# Patient Record
Sex: Female | Born: 1950 | Race: Black or African American | Hispanic: No | Marital: Single | Smoking: Never smoker
Health system: Southern US, Community
[De-identification: ages and names within clinical notes are randomized; demographics above are authoritative.]

## PROBLEM LIST (undated history)

## (undated) DIAGNOSIS — I10 Essential (primary) hypertension: Secondary | ICD-10-CM

## (undated) DIAGNOSIS — M549 Dorsalgia, unspecified: Secondary | ICD-10-CM

## (undated) DIAGNOSIS — E7800 Pure hypercholesterolemia, unspecified: Secondary | ICD-10-CM

## (undated) DIAGNOSIS — G8929 Other chronic pain: Secondary | ICD-10-CM

## (undated) HISTORY — PX: CHOLECYSTECTOMY: SHX55

## (undated) HISTORY — PX: ABDOMINAL HYSTERECTOMY: SHX81

---

## 2011-06-14 ENCOUNTER — Emergency Department (HOSPITAL_BASED_OUTPATIENT_CLINIC_OR_DEPARTMENT_OTHER)
Admission: EM | Admit: 2011-06-14 | Discharge: 2011-06-14 | Disposition: A | Discharge status: Home / Self Care | Payer: BC Managed Care – PPO | Attending: Emergency Medicine | Admitting: Emergency Medicine

## 2011-06-14 ENCOUNTER — Emergency Department (INDEPENDENT_AMBULATORY_CARE_PROVIDER_SITE_OTHER): Payer: BC Managed Care – PPO

## 2011-06-14 ENCOUNTER — Other Ambulatory Visit: Payer: Self-pay

## 2011-06-14 ENCOUNTER — Encounter (HOSPITAL_BASED_OUTPATIENT_CLINIC_OR_DEPARTMENT_OTHER): Payer: Self-pay | Admitting: Emergency Medicine

## 2011-06-14 DIAGNOSIS — M549 Dorsalgia, unspecified: Secondary | ICD-10-CM | POA: Insufficient documentation

## 2011-06-14 DIAGNOSIS — R51 Headache: Secondary | ICD-10-CM

## 2011-06-14 DIAGNOSIS — G8929 Other chronic pain: Secondary | ICD-10-CM | POA: Insufficient documentation

## 2011-06-14 DIAGNOSIS — I10 Essential (primary) hypertension: Secondary | ICD-10-CM | POA: Insufficient documentation

## 2011-06-14 DIAGNOSIS — R42 Dizziness and giddiness: Secondary | ICD-10-CM

## 2011-06-14 HISTORY — DX: Other chronic pain: G89.29

## 2011-06-14 HISTORY — DX: Essential (primary) hypertension: I10

## 2011-06-14 HISTORY — DX: Dorsalgia, unspecified: M54.9

## 2011-06-14 MED ORDER — MECLIZINE HCL 25 MG PO TABS: 25.0000 mg | ORAL_TABLET | Freq: Four times a day (QID) | ORAL | Status: AC

## 2011-06-14 NOTE — ED Notes (Signed)
Pt ambulated to ct and back without incident, normal gait

## 2011-06-14 NOTE — Discharge Instructions (Signed)
Benign Positional Vertigo Vertigo means you feel like you or your surroundings are moving when they are not. Benign positional vertigo is the most common form of vertigo. Benign means that the cause of your condition is not serious. Benign positional vertigo is more common in older adults. CAUSES  Benign positional vertigo is the result of an upset in the labyrinth system. This is an area in the middle ear that helps control your balance. This may be caused by a viral infection, head injury, or repetitive motion. However, often no specific cause is found. SYMPTOMS  Symptoms of benign positional vertigo occur when you move your head or eyes in different directions. Some of the symptoms may include:  Loss of balance and falls.   Vomiting.   Blurred vision.   Dizziness.   Nausea.   Involuntary eye movements (nystagmus).  DIAGNOSIS  Benign positional vertigo is usually diagnosed by physical exam. If the specific cause of your benign positional vertigo is unknown, your caregiver may perform imaging tests, such as magnetic resonance imaging (MRI) or computed tomography (CT). TREATMENT  Your caregiver may recommend movements or procedures to correct the benign positional vertigo. Medicines such as meclizine, benzodiazepines, and medicines for nausea may be used to treat your symptoms. In rare cases, if your symptoms are caused by certain conditions that affect the inner ear, you may need surgery. HOME CARE INSTRUCTIONS   Follow your caregiver's instructions.   Move slowly. Do not make sudden body or head movements.   Avoid driving.   Avoid operating heavy machinery.   Avoid performing any tasks that would be dangerous to you or others during a vertigo episode.   Drink enough fluids to keep your urine clear or pale yellow.  SEEK IMMEDIATE MEDICAL CARE IF:   You develop problems with walking, weakness, numbness, or using your arms, hands, or legs.   You have difficulty speaking.   You  develop severe headaches.   Your nausea or vomiting continues or gets worse.   You develop visual changes.   Your family or friends notice any behavioral changes.   Your condition gets worse.   You have a fever.   You develop a stiff neck or sensitivity to light.  MAKE SURE YOU:   Understand these instructions.   Will watch your condition.   Will get help right away if you are not doing well or get worse.  Document Released: 12/23/2005 Document Revised: 03/06/2011 Document Reviewed: 12/05/2010 [facility] Patient Information 2012 [place], [place].Benign Positional Vertigo Vertigo means you feel like you or your surroundings are moving when they are not. Benign positional vertigo is the most common form of vertigo. Benign means that the cause of your condition is not serious. Benign positional vertigo is more common in older adults. CAUSES  Benign positional vertigo is the result of an upset in the labyrinth system. This is an area in the middle ear that helps control your balance. This may be caused by a viral infection, head injury, or repetitive motion. However, often no specific cause is found. SYMPTOMS  Symptoms of benign positional vertigo occur when you move your head or eyes in different directions. Some of the symptoms may include:  Loss of balance and falls.   Vomiting.   Blurred vision.   Dizziness.   Nausea.   Involuntary eye movements (nystagmus).  DIAGNOSIS  Benign positional vertigo is usually diagnosed by physical exam. If the specific cause of your benign positional vertigo is unknown, your caregiver may perform imaging  tests, such as magnetic resonance imaging (MRI) or computed tomography (CT). TREATMENT  Your caregiver may recommend movements or procedures to correct the benign positional vertigo. Medicines such as meclizine, benzodiazepines, and medicines for nausea may be used to treat your symptoms. In rare cases, if your symptoms are caused by certain  conditions that affect the inner ear, you may need surgery. HOME CARE INSTRUCTIONS   Follow your caregiver's instructions.   Move slowly. Do not make sudden body or head movements.   Avoid driving.   Avoid operating heavy machinery.   Avoid performing any tasks that would be dangerous to you or others during a vertigo episode.   Drink enough fluids to keep your urine clear or pale yellow.  SEEK IMMEDIATE MEDICAL CARE IF:   You develop problems with walking, weakness, numbness, or using your arms, hands, or legs.   You have difficulty speaking.   You develop severe headaches.   Your nausea or vomiting continues or gets worse.   You develop visual changes.   Your family or friends notice any behavioral changes.   Your condition gets worse.   You have a fever.   You develop a stiff neck or sensitivity to light.  MAKE SURE YOU:   Understand these instructions.   Will watch your condition.   Will get help right away if you are not doing well or get worse.  Document Released: 12/23/2005 Document Revised: 03/06/2011 Document Reviewed: 12/05/2010 [facility] Patient Information 2012 [place], [place].

## 2011-06-14 NOTE — ED Provider Notes (Signed)
History     CSN: [id number]  Arrival date & time 06/14/11  1200   None     Chief Complaint  Patient presents with  . Dizziness  . Back Pain    (Consider location/radiation/quality/duration/timing/severity/associated sxs/prior treatment) Patient is a 61 y.o. female presenting with general illness. The history is provided by the patient. No language interpreter was used.  Illness  Episode onset: a month ago. The onset was gradual. The problem occurs continuously. The problem has been gradually worsening. The problem is moderate. The symptoms are relieved by nothing. The symptoms are aggravated by nothing. Associated symptoms include nausea and wheezing. Pertinent negatives include no decreased vision, no vomiting, no cough and no URI. There were no sick contacts. She has received no recent medical care.   Pt complains of having been dizzy for a month.  Pt reports she saw her medical doctor and had labs.  Pt reports her doctor told her she needed to have a scan of her head if dizziness continued.   Pt reports not on amy medications Past Medical History  Diagnosis Date  . Chronic back pain   . Hypertension     History reviewed. No pertinent past surgical history.  History reviewed. No pertinent family history.  History  Substance Use Topics  . Smoking status: Never Smoker   . Smokeless tobacco: Not on file  . Alcohol Use: No    OB History    Grav Para Term Preterm Abortions TAB SAB Ect Mult Living                  Review of Systems  Respiratory: Positive for wheezing. Negative for cough.   Gastrointestinal: Positive for nausea. Negative for vomiting.  All other systems reviewed and are negative.    Allergies  Review of patient's allergies indicates no known allergies.  Home Medications   Current Outpatient Rx  Name Route Sig Dispense Refill  . OMEGA-3 FATTY ACIDS 1000 MG PO CAPS Oral Take 1 g by mouth daily.    Marland Kitchen LOSARTAN POTASSIUM-HCTZ 100-12.5 MG PO TABS Oral  Take 1 tablet by mouth daily.    . MULTIVITAMINS PO CAPS Oral Take 1 capsule by mouth daily.    Marland Kitchen SIMVASTATIN 40 MG PO TABS Oral Take 40 mg by mouth every evening.      BP 153/72  Pulse 52  Temp(Src) 98.3 F (36.8 C) (Oral)  Resp 16  SpO2 100%  Physical Exam  Nursing note and vitals reviewed. Constitutional: She is oriented to person, place, and time. She appears well-developed and well-nourished.  HENT:  Head: Normocephalic.  Eyes: Conjunctivae and EOM are normal. Pupils are equal, round, and reactive to light.  Neck: Normal range of motion. Neck supple.  Cardiovascular: Normal rate.   Pulmonary/Chest: Effort normal.  Abdominal: Soft.  Musculoskeletal: Normal range of motion.  Neurological: She is alert and oriented to person, place, and time. She has normal reflexes.  Skin: Skin is warm.  Psychiatric: She has a normal mood and affect.    ED Course  Procedures (including critical care time)  Labs Reviewed - No data to display No results found.   No diagnosis found.    MDM   Date: 06/14/2011  Rate: 51  Rhythm: sinus bradycardia  QRS Axis: normal  Intervals: normal  ST/T Wave abnormalities: nonspecific T wave changes  Conduction Disutrbances:none  Narrative Interpretation:   Old EKG Reviewed: none available   No results found for this or any previous visit. Ct Head  Wo Contrast  06/14/2011  *RADIOLOGY REPORT*  Clinical Data: Dizziness, headache  CT HEAD WITHOUT CONTRAST  Technique:  Contiguous axial images were obtained from the base of the skull through the vertex without contrast.  Comparison: None.  Findings: No skull fracture is noted.  Paranasal sinuses and mastoid air cells are unremarkable.  No intracranial hemorrhage, mass effect or midline shift.  The gray and white matter differentiation is preserved.  No acute infarction.  No mass lesion is noted on this unenhanced scan.  No intra or extra-axial fluid collection.  IMPRESSION: No acute intracranial  abnormality.  Original Report Authenticated By: Natasha Mead, M.D.      Pt advised follow up with her MD for recheck this week.  I will treat with antivert.   Lonia Skinner [place], [place] 06/14/11 1618  Lonia Skinner [place], [place] 06/14/11 1620

## 2011-06-14 NOTE — ED Notes (Signed)
Pt having intermittent dizziness for two months.  Saw MD one month ago for same but continues to have the dizziness.  No weakness, LOC or blurry vision.  Lower back pain chronic but worse lately.

## 2011-06-15 NOTE — ED Provider Notes (Signed)
Medical screening examination/treatment/procedure(s) were performed by non-physician practitioner and as supervising physician I was immediately available for consultation/collaboration.   Jackie West. [name], MD 06/15/11 [phone]

## 2017-06-27 ENCOUNTER — Emergency Department (HOSPITAL_BASED_OUTPATIENT_CLINIC_OR_DEPARTMENT_OTHER)
Admission: EM | Admit: 2017-06-27 | Discharge: 2017-06-27 | Disposition: A | Discharge status: Home / Self Care | Payer: Commercial Managed Care - HMO | Attending: Emergency Medicine | Admitting: Emergency Medicine

## 2017-06-27 ENCOUNTER — Other Ambulatory Visit: Payer: Self-pay

## 2017-06-27 ENCOUNTER — Encounter (HOSPITAL_BASED_OUTPATIENT_CLINIC_OR_DEPARTMENT_OTHER): Payer: Self-pay | Admitting: Emergency Medicine

## 2017-06-27 DIAGNOSIS — I10 Essential (primary) hypertension: Secondary | ICD-10-CM | POA: Diagnosis not present

## 2017-06-27 DIAGNOSIS — Z79899 Other long term (current) drug therapy: Secondary | ICD-10-CM | POA: Insufficient documentation

## 2017-06-27 HISTORY — DX: Pure hypercholesterolemia, unspecified: E78.00

## 2017-06-27 MED ORDER — HYDROCHLOROTHIAZIDE 25 MG PO TABS: 12.5000 mg | ORAL_TABLET | Freq: Every day | ORAL | 0 refills | Status: AC

## 2017-06-27 MED ORDER — VALSARTAN 160 MG PO TABS: 160.0000 mg | ORAL_TABLET | Freq: Every day | ORAL | 0 refills | Status: AC

## 2017-06-27 NOTE — ED Triage Notes (Signed)
Pt states her BP medicine has been recalled so when she went to the pharmacy yesterday they would not refill it. Pt c/o headache.

## 2017-06-27 NOTE — ED Provider Notes (Signed)
[place] EMERGENCY DEPARTMENT Provider Note   CSN: [id number] Arrival date & time: 06/27/17  1455     History   Chief Complaint Chief Complaint  Patient presents with  . Hypertension    HPI Jackie West is a 67 y.o. female presenting for evaluation of high blood pressure and medication refill.  She states she has been on the same blood pressure medicine, a combo of losartan and hydrochlorothiazide for the past 5 years.  She has been on this for the same dose.  She states she tried to refill her blood pressure medication, but was told it was recalled and was unable to do so.  Last dose was last night.  She does not currently have any more pills.  Her PCP is Dr. Edison Pace.  She denies chest pain, shortness breath, vision changes, slurred speech, weakness, numbness.  She denies recent fevers, chills, nausea, vomiting, abdominal pain, urinary symptoms, abnormal bowel movements.  She reports a mild generalized headache.   HPI  Past Medical History:  Diagnosis Date  . Chronic back pain   . High cholesterol   . Hypertension     There are no active problems to display for this patient.   History reviewed. No pertinent surgical history.   OB History   None      Home Medications    Prior to Admission medications   Medication Sig Start Date End Date Taking? Authorizing Provider  losartan-hydrochlorothiazide (HYZAAR) 100-12.5 MG per tablet Take 1 tablet by mouth daily.   Yes [provider]  OVER THE COUNTER MEDICATION    Yes [provider]  fish oil-omega-3 fatty acids 1000 MG capsule Take 1 g by mouth daily.    [provider]  hydrochlorothiazide (HYDRODIURIL) 25 MG tablet Take 0.5 tablets (12.5 mg total) by mouth daily. 06/27/17   Delorese Sellin, PA-C  Multiple Vitamin (MULTIVITAMIN) capsule Take 1 capsule by mouth daily.    [provider]  simvastatin (ZOCOR) 40 MG tablet Take 40 mg by mouth every evening.    [provider]  valsartan (DIOVAN) 160 MG tablet Take 1 tablet (160 mg total) by mouth daily. 06/27/17   Imani Sherrin, PA-C    Family History No family history on file.  Social History Social History   Tobacco Use  . Smoking status: Never Smoker  . Smokeless tobacco: Never Used  Substance Use Topics  . Alcohol use: No  . Drug use: No     Allergies   Patient has no known allergies.   Review of Systems Review of Systems  Constitutional: Negative for chills and fever.  HENT: Negative for congestion.   Eyes: Negative for visual disturbance.  Respiratory: Negative for cough, chest tightness and shortness of breath.   Cardiovascular: Negative for chest pain and leg swelling.  Gastrointestinal: Negative for abdominal pain, nausea and vomiting.  Genitourinary: Negative for dysuria, hematuria and urgency.  Musculoskeletal: Negative for back pain and neck pain.  Neurological: Positive for headaches. Negative for dizziness, weakness, light-headedness and numbness.  Hematological: Does not bruise/bleed easily.  Psychiatric/Behavioral: Negative for confusion.     Physical Exam Updated Vital Signs BP 118/64 (BP Location: Left Arm)   Pulse 63   Temp 99.4 F (37.4 C) (Oral)   Resp 18   Ht 5\' 5"  (1.651 m)   Wt 82.1 kg (181 lb)   SpO2 98%   BMI 30.12 kg/m   Physical Exam  Constitutional: She is oriented to person, place, and time. She  appears well-developed and well-nourished. No distress.  HENT:  Head: Normocephalic and atraumatic.  Right Ear: Tympanic membrane, external ear and ear canal normal.  Left Ear: Tympanic membrane, external ear and ear canal normal.  Nose: Nose normal.  Mouth/Throat: Uvula is midline, oropharynx is clear and moist and mucous membranes are normal.  Eyes: Pupils are equal, round, and reactive to light. Conjunctivae and EOM are normal.  Neck: Normal range of motion. Neck supple.  Cardiovascular: Normal rate, regular rhythm and intact distal  pulses.  Pulmonary/Chest: Effort normal and breath sounds normal. No respiratory distress. She has no wheezes.  Abdominal: Soft. Bowel sounds are normal. She exhibits no distension. There is no tenderness.  Musculoskeletal: Normal range of motion.  Strength intact x4.  Sensation intact x4.  Radial and pedal pulses equal bilaterally.  Ambulatory without difficulty.  Neurological: She is alert and oriented to person, place, and time. She has normal strength. No cranial nerve deficit or sensory deficit. Coordination normal. GCS eye subscore is 4. GCS verbal subscore is 5. GCS motor subscore is 6.  No obvious neurologic deficits.  Nose to finger intact.  Skin: Skin is warm and dry.  Psychiatric: She has a normal mood and affect.  Nursing note and vitals reviewed.    ED Treatments / Results  Labs (all labs ordered are listed, but only abnormal results are displayed) Labs Reviewed - No data to display  EKG None  Radiology No results found.  Procedures Procedures (including critical care time)  Medications Ordered in ED Medications - No data to display   Initial Impression / Assessment and Plan / ED Course  I have reviewed the triage vital signs and the nursing notes.  Pertinent labs & imaging results that were available during my care of the patient were reviewed by me and considered in my medical decision making (see chart for details).     Patient presenting for evaluation of blood pressure and blood pressure medication refill due to recall.  Physical exam shows no neurologic deficits.  Blood pressure initially elevated, but resolved without intervention.  Doubt endorgan damage, no signs of hypertensive urgency.  Will refill blood pressure medications, and instructed to follow-up with primary care for further management.  At this time, patient appears safe for discharge.  Return precautions given.  Patient states she understands and agrees to plan.   Final Clinical Impressions(s)  / ED Diagnoses   Final diagnoses:  Hypertension, unspecified type    ED Discharge Orders        Ordered    hydrochlorothiazide (HYDRODIURIL) 25 MG tablet  Daily     06/27/17 1646    valsartan (DIOVAN) 160 MG tablet  Daily     06/27/17 1646       Layci Stenglein, PA-C 06/27/17 1709    Tanna Furry, MD 07/05/17 2340

## 2017-06-27 NOTE — Discharge Instructions (Signed)
Take both blood pressure medications daily.  Call your doctor on monday for further management of your medications. Return to the ER if you develop chest pain, difficulty breathing, weakness, numbness, or any new or concerning symptoms.

## 2017-08-06 ENCOUNTER — Other Ambulatory Visit: Payer: Self-pay

## 2017-08-06 ENCOUNTER — Emergency Department (HOSPITAL_BASED_OUTPATIENT_CLINIC_OR_DEPARTMENT_OTHER)
Admission: EM | Admit: 2017-08-06 | Discharge: 2017-08-07 | Disposition: A | Discharge status: Home / Self Care | Payer: Medicare HMO | Attending: Emergency Medicine | Admitting: Emergency Medicine

## 2017-08-06 ENCOUNTER — Emergency Department (HOSPITAL_BASED_OUTPATIENT_CLINIC_OR_DEPARTMENT_OTHER): Payer: Medicare HMO

## 2017-08-06 ENCOUNTER — Encounter (HOSPITAL_BASED_OUTPATIENT_CLINIC_OR_DEPARTMENT_OTHER): Payer: Self-pay | Admitting: Adult Health

## 2017-08-06 DIAGNOSIS — R1032 Left lower quadrant pain: Secondary | ICD-10-CM | POA: Diagnosis present

## 2017-08-06 DIAGNOSIS — I10 Essential (primary) hypertension: Secondary | ICD-10-CM | POA: Insufficient documentation

## 2017-08-06 DIAGNOSIS — Z79899 Other long term (current) drug therapy: Secondary | ICD-10-CM | POA: Insufficient documentation

## 2017-08-06 DIAGNOSIS — N838 Other noninflammatory disorders of ovary, fallopian tube and broad ligament: Secondary | ICD-10-CM | POA: Diagnosis not present

## 2017-08-06 LAB — CBC WITH DIFFERENTIAL/PLATELET
Basophils Absolute: 0 10*3/uL (ref 0.0–0.1)
Basophils Relative: 1 %
Eosinophils Absolute: 0.2 10*3/uL (ref 0.0–0.7)
Eosinophils Relative: 3 %
HCT: 39.7 % (ref 36.0–46.0)
Hemoglobin: 13.1 g/dL (ref 12.0–15.0)
Lymphocytes Relative: 39 %
Lymphs Abs: 2.2 10*3/uL (ref 0.7–4.0)
MCH: 28.3 pg (ref 26.0–34.0)
MCHC: 33 g/dL (ref 30.0–36.0)
MCV: 85.7 fL (ref 78.0–100.0)
Monocytes Absolute: 0.4 10*3/uL (ref 0.1–1.0)
Monocytes Relative: 8 %
Neutro Abs: 2.7 10*3/uL (ref 1.7–7.7)
Neutrophils Relative %: 49 %
Platelets: 277 10*3/uL (ref 150–400)
RBC: 4.63 MIL/uL (ref 3.87–5.11)
RDW: 13.6 % (ref 11.5–15.5)
WBC: 5.5 10*3/uL (ref 4.0–10.5)

## 2017-08-06 LAB — COMPREHENSIVE METABOLIC PANEL
ALT: 41 U/L (ref 14–54)
AST: 34 U/L (ref 15–41)
Albumin: 4 g/dL (ref 3.5–5.0)
Alkaline Phosphatase: 104 U/L (ref 38–126)
Anion gap: 9 (ref 5–15)
BUN: 13 mg/dL (ref 6–20)
CO2: 27 mmol/L (ref 22–32)
Calcium: 9.3 mg/dL (ref 8.9–10.3)
Chloride: 105 mmol/L (ref 101–111)
Creatinine, Ser: 0.7 mg/dL (ref 0.44–1.00)
GFR calc Af Amer: >60 mL/min (ref >60)
GFR calc non Af Amer: >60 mL/min (ref >60)
Glucose, Bld: 91 mg/dL (ref 65–99)
Potassium: 3.3 mmol/L — ABNORMAL LOW (ref 3.5–5.1)
Sodium: 141 mmol/L (ref 135–145)
Total Bilirubin: 0.3 mg/dL (ref 0.3–1.2)
Total Protein: 7.5 g/dL (ref 6.5–8.1)

## 2017-08-06 LAB — URINALYSIS, ROUTINE W REFLEX MICROSCOPIC
Bilirubin Urine: NEGATIVE
Glucose, UA: NEGATIVE mg/dL
Hgb urine dipstick: NEGATIVE
Ketones, ur: NEGATIVE mg/dL
Leukocytes, UA: NEGATIVE
Nitrite: NEGATIVE
Protein, ur: NEGATIVE mg/dL
Specific Gravity, Urine: <1.005 — ABNORMAL LOW (ref 1.005–1.030)
pH: 6.5 (ref 5.0–8.0)

## 2017-08-06 MED ORDER — IOPAMIDOL (ISOVUE-300) INJECTION 61%
100.0000 mL | Freq: Once | INTRAVENOUS | Status: AC | PRN
  Administered 2017-08-06: 100 mL via INTRAVENOUS

## 2017-08-06 MED ORDER — IBUPROFEN 800 MG PO TABS: 800.0000 mg | ORAL_TABLET | Freq: Three times a day (TID) | ORAL | 0 refills | Status: AC | PRN

## 2017-08-06 NOTE — ED Triage Notes (Signed)
Presents with a week and half of left sided leg pain that goes from groin, hip and down the front of her leg.

## 2017-08-06 NOTE — ED Provider Notes (Signed)
[facility] DEPART[facility] Provider Note   CSN: [id number] Arrival date & time: 08/06/17  1837     History   Chief Complaint Chief Complaint  Patient presents with  . Leg Pain  . Abdominal Pain    HPI Jackie West is a 67 y.o. female.  HPI Patient presents to the emergency department with left lower abdominal discomfort that started 1 week ago.  The patient states that she attempted to see her physician but was unable to get an appointment.  She states that the pain seemed to increase over that timeframe.  She states that she did not take any medications other than her arthritis medicines prior to arrival.  States that they did not seem to significantly decrease the pain.  Patient states that certain movements and palpation made the pain worse the patient denies chest pain, shortness of breath, headache,blurred vision, neck pain, fever, cough, weakness, numbness, dizziness, anorexia, edema, , nausea, vomiting, diarrhea, rash, back pain, dysuria, hematemesis, bloody stool, near syncope, or syncope. Past Medical History:  Diagnosis Date  . Chronic back pain   . High cholesterol   . Hypertension     There are no active problems to display for this patient.   History reviewed. No pertinent surgical history.   OB History   None      Home Medications    Prior to Admission medications   Medication Sig Start Date End Date Taking? Authorizing Provider  calcium-vitamin D (SM CALCIUM 500/VITAMIN D3) 500-400 MG-UNIT tablet Frequency:   Dosage:0   MG-UNIT  Instructions:  Note:1 tab daily 05/29/08  Yes [provider]  celecoxib (CELEBREX) 100 MG capsule TAKE 1 CAPSULE(100 MG) BY MOUTH TWICE DAILY 07/27/17  Yes [provider]  fish oil-omega-3 fatty acids 1000 MG capsule Take 1 g by mouth daily.    [provider]  hydrochlorothiazide (HYDRODIURIL) 25 MG tablet Take 0.5 tablets (12.5 mg total) by mouth daily. 06/27/17   Caccavale, Sophia,  PA-C  losartan-hydrochlorothiazide (HYZAAR) 100-12.5 MG per tablet Take 1 tablet by mouth daily.    [provider]  Multiple Vitamin (MULTIVITAMIN) capsule Take 1 capsule by mouth daily.    [provider]  OVER THE COUNTER MEDICATION     [provider]  simvastatin (ZOCOR) 40 MG tablet Take 40 mg by mouth every evening.    [provider]  valsartan (DIOVAN) 160 MG tablet Take 1 tablet (160 mg total) by mouth daily. 06/27/17   Caccavale, Sophia, PA-C    Family History History reviewed. No pertinent family history.  Social History Social History   Tobacco Use  . Smoking status: Never Smoker  . Smokeless tobacco: Never Used  Substance Use Topics  . Alcohol use: No  . Drug use: No     Allergies   Patient has no known allergies.   Review of Systems Review of Systems All other systems negative except as documented in the HPI. All pertinent positives and negatives as reviewed in the HPI.  Physical Exam Updated Vital Signs BP 139/72 (BP Location: Left Arm)   Pulse (!) 51   Temp 98.5 F (36.9 C) (Oral)   Resp 16   Ht 5\' 5"  (1.651 m)   Wt 78.5 kg (173 lb)   SpO2 98%   BMI 28.79 kg/m   Physical Exam  Constitutional: She is oriented to person, place, and time. She appears well-developed and well-nourished. No distress.  HENT:  Head: Normocephalic and atraumatic.  Mouth/Throat: Oropharynx is clear  and moist.  Eyes: Pupils are equal, round, and reactive to light.  Neck: Normal range of motion. Neck supple.  Cardiovascular: Normal rate, regular rhythm and normal heart sounds. Exam reveals no gallop and no friction rub.  No murmur heard. Pulmonary/Chest: Effort normal and breath sounds normal. No respiratory distress. She has no wheezes.  Abdominal: Soft. Bowel sounds are normal. She exhibits no distension. There is no hepatosplenomegaly or splenomegaly. There is tenderness in the left lower quadrant. There is no rigidity and no guarding.    Neurological: She is alert and oriented to person, place, and time. She exhibits normal muscle tone. Coordination normal.  Skin: Skin is warm and dry. Capillary refill takes less than 2 seconds. No rash noted. No erythema.  Psychiatric: She has a normal mood and affect. Her behavior is normal.  Nursing note and vitals reviewed.    ED Treatments / Results  Labs (all labs ordered are listed, but only abnormal results are displayed) Labs Reviewed  COMPREHENSIVE METABOLIC PANEL - Abnormal; Notable for the following components:      Result Value   Potassium 3.3 (*)    All other components within normal limits  URINALYSIS, ROUTINE W REFLEX MICROSCOPIC - Abnormal; Notable for the following components:   Specific Gravity, Urine <1.005 (*)    All other components within normal limits  CBC WITH DIFFERENTIAL/PLATELET    EKG None  Radiology Ct Abdomen Pelvis W Contrast  Result Date: 08/06/2017 CLINICAL DATA:  67 year old female with abdominal pain. Left hip pain. EXAM: CT ABDOMEN AND PELVIS WITH CONTRAST TECHNIQUE: Multidetector CT imaging of the abdomen and pelvis was performed using the standard protocol following bolus administration of intravenous contrast. CONTRAST:  125mL ISOVUE-300 IOPAMIDOL (ISOVUE-300) INJECTION 61% COMPARISON:  None. FINDINGS: Lower chest: Minimal bibasilar linear atelectasis/scarring. The visualized lung bases are otherwise clear. No intra-abdominal free air or free fluid. Hepatobiliary: The liver is unremarkable. No intrahepatic biliary ductal dilatation. Small stones noted within the gallbladder. No pericholecystic fluid or evidence of acute cholecystitis by CT. Pancreas: Unremarkable. No pancreatic ductal dilatation or surrounding inflammatory changes. Spleen: Normal in size without focal abnormality. Adrenals/Urinary Tract: Adrenal glands are unremarkable. Kidneys are normal, without renal calculi, focal lesion, or hydronephrosis. Bladder is unremarkable.  Stomach/Bowel: Small scattered sigmoid diverticula without active inflammatory changes. There is no bowel obstruction or active inflammation. Normal appendix. Vascular/Lymphatic: There is moderate aortoiliac atherosclerotic disease. No portal venous gas. There is no adenopathy. The IVC appears unremarkable. Reproductive: The uterus is anteverted. There is a 3 cm anterior body fibroid. There is an 11 x 9 cm multiseptated cystic mass in the posterior pelvis with areas of calcification of the septa. This mass abuts the posterior uterus and the broad ligament. It is indeterminate whether this mass is arising from the right or left ovary. However, there is suggestion of left ovarian tissue in the periphery of this mass favoring a left ovarian origin. Further evaluation with MRI without and with contrast is recommended. Other: None Musculoskeletal: Degenerative changes of the spine. No acute osseous pathology. IMPRESSION: 1. Complex multiseptated cystic mass in the pelvis consistent with an ovarian origin lesion. Further characterization with MRI without and with contrast, or alternatively surgical consult, is recommended. 2. Cholelithiasis. 3. No bowel obstruction or active inflammation.  Normal appendix. 4. Moderate Aortic Atherosclerosis (ICD10-I70.0). Electronically Signed   By: Anner Crete M.D.   On: 08/06/2017 23:04    Procedures Procedures (including critical care time)  Medications Ordered in ED Medications  iopamidol (ISOVUE-300) 61 %  injection 100 mL (100 mLs Intravenous Contrast Given 08/06/17 2222)     Initial Impression / Assessment and Plan / ED Course  I have reviewed the triage vital signs and the nursing notes.  Pertinent labs & imaging results that were available during my care of the patient were reviewed by me and considered in my medical decision making (see chart for details).     I explained to the patient the significance of the CT scan findings.  I did advise her that this  will need to be treated as cancer until proven otherwise.  I advised her that she would need to follow-up with GYN.  The patient would prefer to speak with her primary doctor about a referral to someone within their network.  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  Patient understands the severity of the diagnosis.  Patient agrees to the plan all questions were answered I did speak with the daughter in the room as well who will help ensure that the plan is follow-through.  Final Clinical Impressions(s) / ED Diagnoses   Final diagnoses:  None    ED Discharge Orders    None       Dalia Heading, PA-C 08/06/17 2351    Julianne Rice, MD 08/07/17 [phone]

## 2017-08-06 NOTE — Discharge Instructions (Addendum)
Call your doctor first thing tomorrow morning to help facilitate follow-up with GYN as soon as possible.  We need to assume that this is a cancerous mass until proven otherwise.  Return here for any worsening in your condition

## 2018-11-18 IMAGING — CT CT ABD-PELV W/ CM
2 of 5 series · 15 of 46 positions shown, 17 images · IV contrast (APPLIED)
Comparison: None.

CLINICAL DATA: 66-year-old female with abdominal pain. Left hip
pain.

EXAM:
CT ABDOMEN AND PELVIS WITH CONTRAST
TECHNIQUE: Multidetector CT imaging of the abdomen and pelvis was performed
using the standard protocol following bolus administration of
intravenous contrast.
CONTRAST:  100mL GZL8TE-3XX IOPAMIDOL (GZL8TE-3XX) INJECTION 61%

[Series 2: axial st · axial · 0.77mm/px · z∈[+627,+1027]mm · 12 of 90 slices shown, 14 images]
[im 5/90  soft-tissue]
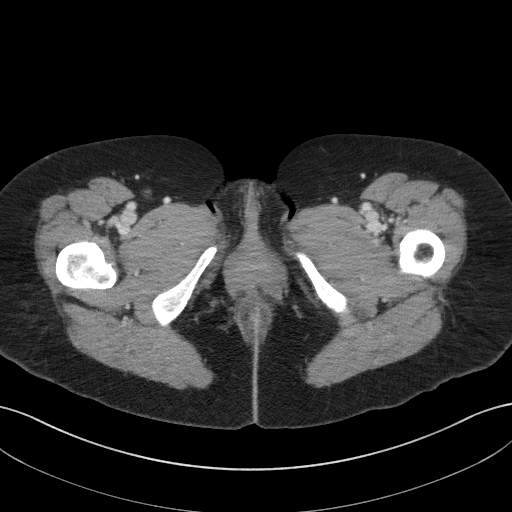
[im 5/90  bone]
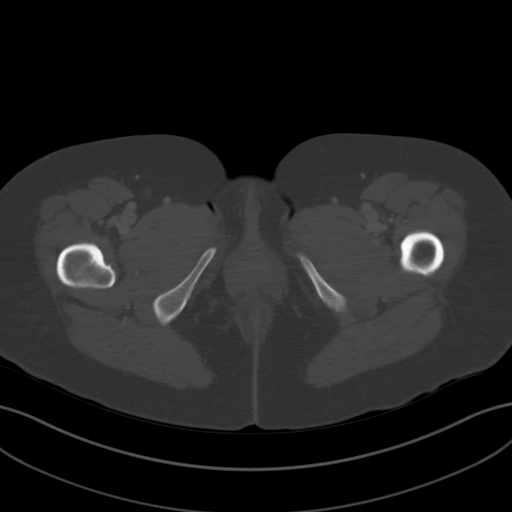
[im 15/90  soft-tissue]
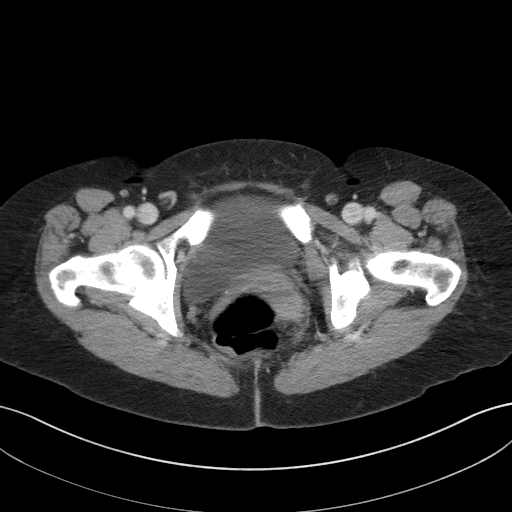
[im 19/90  soft-tissue]
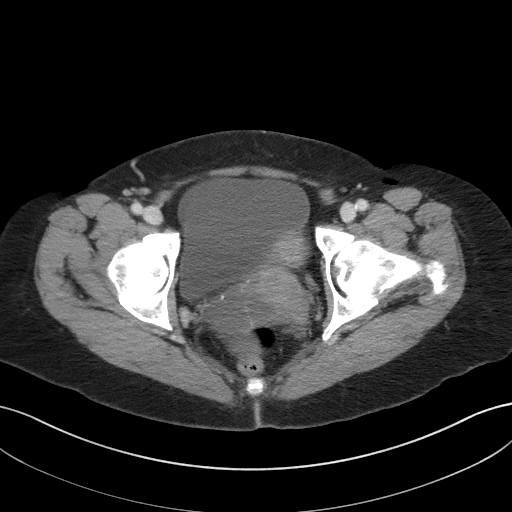
[im 29/90  soft-tissue]
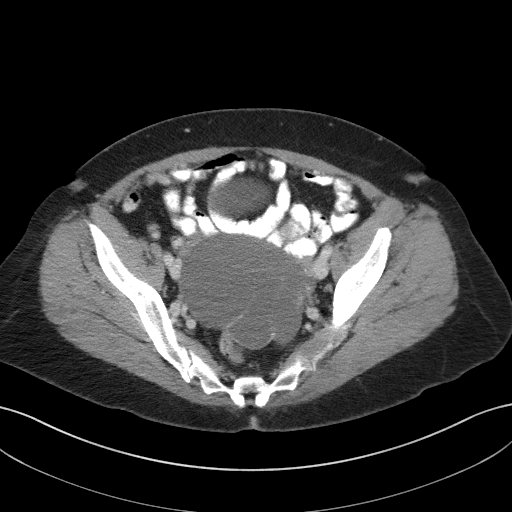
[im 33/90  soft-tissue]
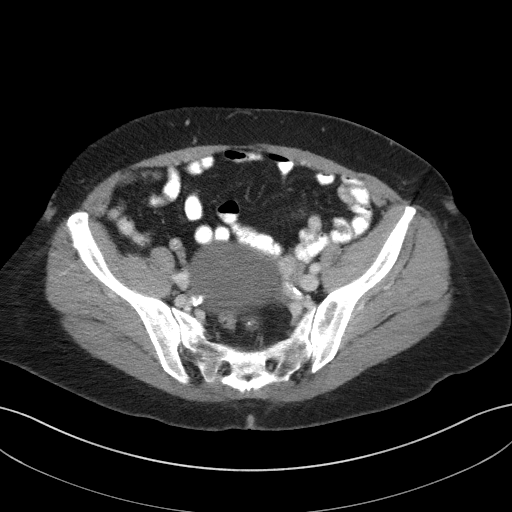
[im 43/90  soft-tissue]
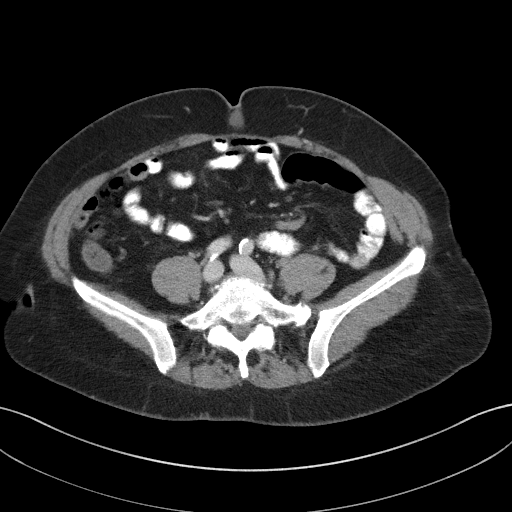
[im 47/90  soft-tissue]
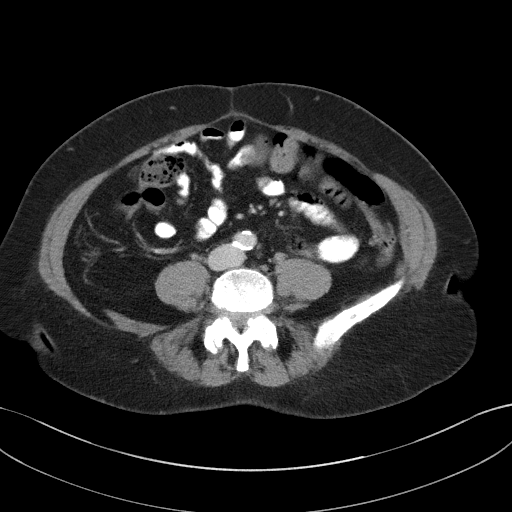
[im 57/90  soft-tissue]
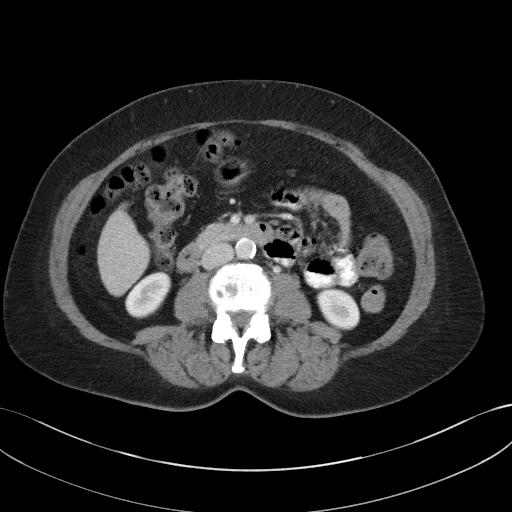
[im 61/90  soft-tissue]
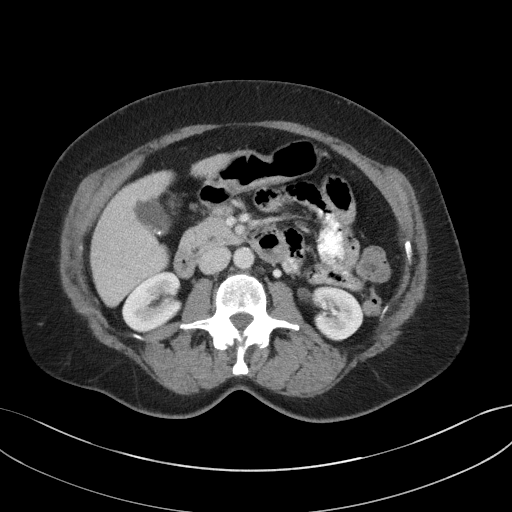
[im 61/90  bone]
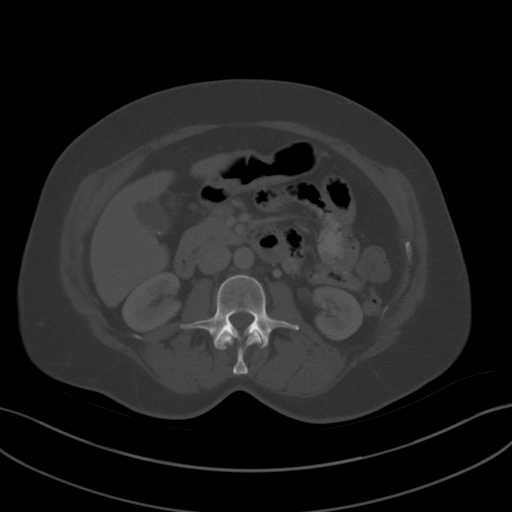
[im 71/90  soft-tissue]
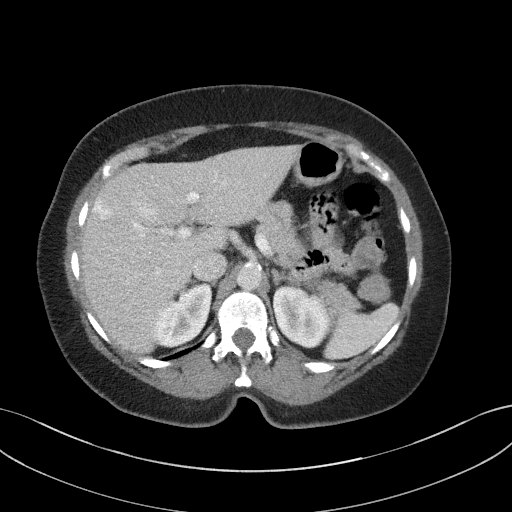
[im 75/90  soft-tissue]
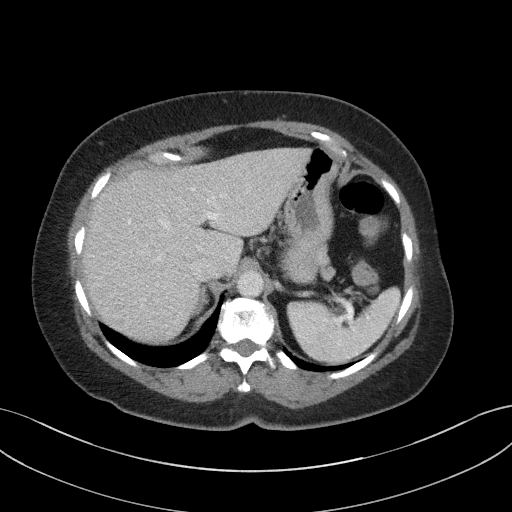
[im 85/90  soft-tissue]
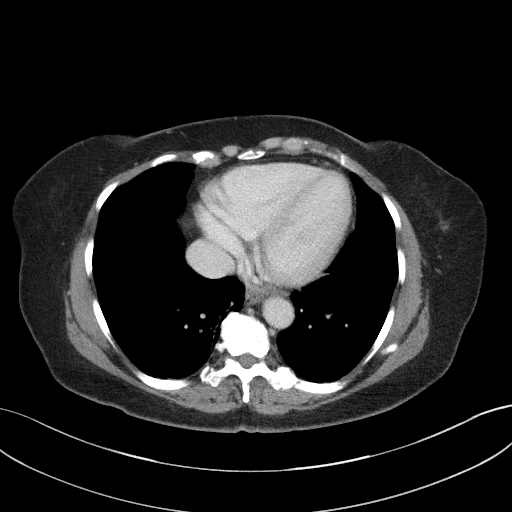

[Series 4: coronal st · coronal · 0.70mm/px · 3 of 101 slices shown]
[im 34/101  soft-tissue]
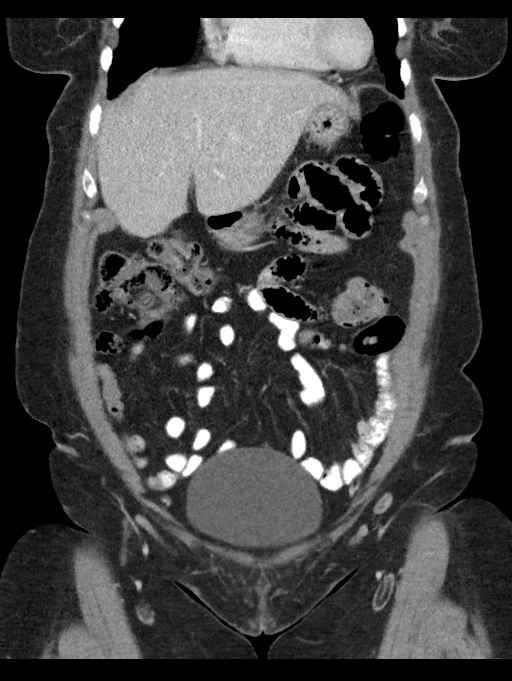
[im 45/101  soft-tissue]
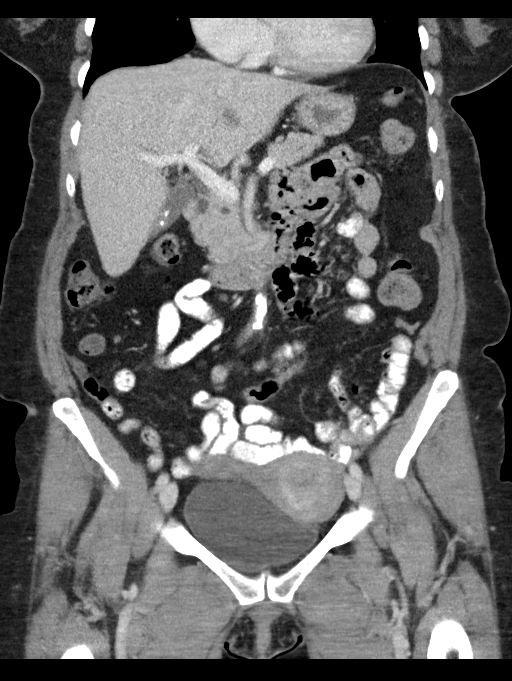
[im 56/101  soft-tissue]
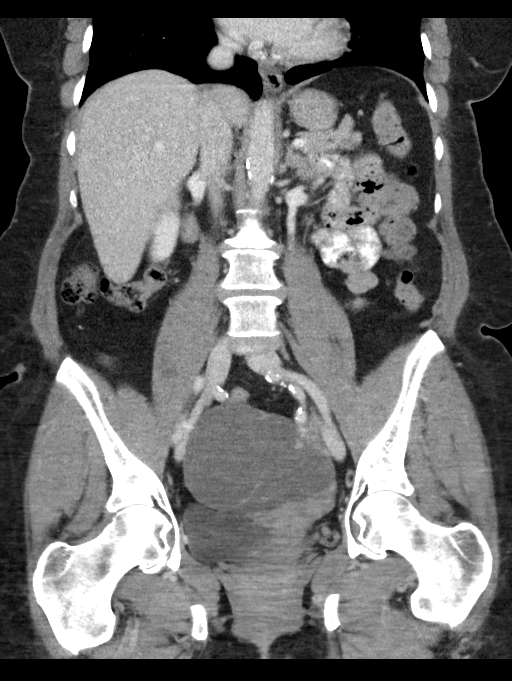

[15 of 46 positions shown; findings below may reference images not displayed]

FINDINGS: Lower chest: Minimal bibasilar linear atelectasis/scarring. The
visualized lung bases are otherwise clear.

No intra-abdominal free air or free fluid.

Hepatobiliary: The liver is unremarkable. No intrahepatic biliary
ductal dilatation. Small stones noted within the gallbladder. No
pericholecystic fluid or evidence of acute cholecystitis by CT.

Pancreas: Unremarkable. No pancreatic ductal dilatation or
surrounding inflammatory changes.

Spleen: Normal in size without focal abnormality.

Adrenals/Urinary Tract: Adrenal glands are unremarkable. Kidneys are
normal, without renal calculi, focal lesion, or hydronephrosis.
Bladder is unremarkable.

Stomach/Bowel: Small scattered sigmoid diverticula without active
inflammatory changes. There is no bowel obstruction or active
inflammation. Normal appendix.

Vascular/Lymphatic: There is moderate aortoiliac atherosclerotic
disease. No portal venous gas. There is no adenopathy. The IVC
appears unremarkable.

Reproductive: The uterus is anteverted. There is a 3 cm anterior
body fibroid. There is an 11 x 9 cm multiseptated cystic mass in the
posterior pelvis with areas of calcification of the septa. This mass
abuts the posterior uterus and the broad ligament. It is
indeterminate whether this mass is arising from the right or left
ovary. However, there is suggestion of left ovarian tissue in the
periphery of this mass favoring a left ovarian origin. Further
evaluation with MRI without and with contrast is recommended.

Other: None

Musculoskeletal: Degenerative changes of the spine. No acute osseous
pathology.
IMPRESSION: 1. Complex multiseptated cystic mass in the pelvis consistent with
an ovarian origin lesion. Further characterization with MRI without
and with contrast, or alternatively surgical consult, is
recommended.
2. Cholelithiasis.
3. No bowel obstruction or active inflammation.  Normal appendix.
4. Moderate Aortic Atherosclerosis ([id number]).

## 2019-04-27 DIAGNOSIS — Z0000 Encounter for general adult medical examination without abnormal findings: Secondary | ICD-10-CM | POA: Diagnosis not present

## 2019-04-27 DIAGNOSIS — I10 Essential (primary) hypertension: Secondary | ICD-10-CM | POA: Diagnosis not present

## 2019-04-27 DIAGNOSIS — Z2821 Immunization not carried out because of patient refusal: Secondary | ICD-10-CM | POA: Diagnosis not present

## 2019-04-27 DIAGNOSIS — E785 Hyperlipidemia, unspecified: Secondary | ICD-10-CM | POA: Diagnosis not present

## 2019-04-27 DIAGNOSIS — K432 Incisional hernia without obstruction or gangrene: Secondary | ICD-10-CM | POA: Diagnosis not present

## 2019-05-31 DIAGNOSIS — R001 Bradycardia, unspecified: Secondary | ICD-10-CM | POA: Diagnosis not present

## 2019-05-31 DIAGNOSIS — Z8679 Personal history of other diseases of the circulatory system: Secondary | ICD-10-CM | POA: Diagnosis not present

## 2019-05-31 DIAGNOSIS — I493 Ventricular premature depolarization: Secondary | ICD-10-CM | POA: Diagnosis not present

## 2019-05-31 DIAGNOSIS — R002 Palpitations: Secondary | ICD-10-CM | POA: Diagnosis not present

## 2019-06-09 DIAGNOSIS — Z1211 Encounter for screening for malignant neoplasm of colon: Secondary | ICD-10-CM | POA: Diagnosis not present

## 2019-06-09 DIAGNOSIS — R002 Palpitations: Secondary | ICD-10-CM | POA: Diagnosis not present

## 2019-06-30 DIAGNOSIS — I493 Ventricular premature depolarization: Secondary | ICD-10-CM | POA: Diagnosis not present

## 2019-06-30 DIAGNOSIS — Z8249 Family history of ischemic heart disease and other diseases of the circulatory system: Secondary | ICD-10-CM | POA: Diagnosis not present

## 2019-06-30 DIAGNOSIS — E785 Hyperlipidemia, unspecified: Secondary | ICD-10-CM | POA: Diagnosis not present

## 2019-06-30 DIAGNOSIS — R002 Palpitations: Secondary | ICD-10-CM | POA: Diagnosis not present

## 2019-06-30 DIAGNOSIS — I10 Essential (primary) hypertension: Secondary | ICD-10-CM | POA: Diagnosis not present

## 2019-07-01 DIAGNOSIS — I10 Essential (primary) hypertension: Secondary | ICD-10-CM | POA: Diagnosis not present

## 2019-07-01 DIAGNOSIS — I440 Atrioventricular block, first degree: Secondary | ICD-10-CM | POA: Diagnosis not present

## 2019-07-01 DIAGNOSIS — R001 Bradycardia, unspecified: Secondary | ICD-10-CM | POA: Diagnosis not present

## 2019-07-05 DIAGNOSIS — I517 Cardiomegaly: Secondary | ICD-10-CM | POA: Diagnosis not present

## 2019-07-27 DIAGNOSIS — R002 Palpitations: Secondary | ICD-10-CM | POA: Diagnosis not present

## 2019-07-28 DIAGNOSIS — I471 Supraventricular tachycardia: Secondary | ICD-10-CM | POA: Diagnosis not present

## 2019-07-28 DIAGNOSIS — I491 Atrial premature depolarization: Secondary | ICD-10-CM | POA: Diagnosis not present

## 2019-07-28 DIAGNOSIS — I440 Atrioventricular block, first degree: Secondary | ICD-10-CM | POA: Diagnosis not present

## 2019-07-28 DIAGNOSIS — I493 Ventricular premature depolarization: Secondary | ICD-10-CM | POA: Diagnosis not present

## 2019-07-29 DIAGNOSIS — I10 Essential (primary) hypertension: Secondary | ICD-10-CM | POA: Diagnosis not present

## 2019-08-20 DIAGNOSIS — Y999 Unspecified external cause status: Secondary | ICD-10-CM | POA: Diagnosis not present

## 2019-08-20 DIAGNOSIS — W1839XA Other fall on same level, initial encounter: Secondary | ICD-10-CM | POA: Diagnosis not present

## 2019-08-20 DIAGNOSIS — S99921A Unspecified injury of right foot, initial encounter: Secondary | ICD-10-CM | POA: Diagnosis not present

## 2019-08-20 DIAGNOSIS — M7731 Calcaneal spur, right foot: Secondary | ICD-10-CM | POA: Diagnosis not present

## 2019-08-20 DIAGNOSIS — M25571 Pain in right ankle and joints of right foot: Secondary | ICD-10-CM | POA: Diagnosis not present

## 2019-08-21 DIAGNOSIS — S99921A Unspecified injury of right foot, initial encounter: Secondary | ICD-10-CM | POA: Diagnosis not present

## 2019-08-31 DIAGNOSIS — Z8249 Family history of ischemic heart disease and other diseases of the circulatory system: Secondary | ICD-10-CM | POA: Diagnosis not present

## 2019-08-31 DIAGNOSIS — I493 Ventricular premature depolarization: Secondary | ICD-10-CM | POA: Diagnosis not present

## 2019-12-01 DIAGNOSIS — E559 Vitamin D deficiency, unspecified: Secondary | ICD-10-CM | POA: Diagnosis not present

## 2019-12-01 DIAGNOSIS — Z780 Asymptomatic menopausal state: Secondary | ICD-10-CM | POA: Diagnosis not present

## 2019-12-01 DIAGNOSIS — Z1231 Encounter for screening mammogram for malignant neoplasm of breast: Secondary | ICD-10-CM | POA: Diagnosis not present

## 2019-12-01 DIAGNOSIS — M170 Bilateral primary osteoarthritis of knee: Secondary | ICD-10-CM | POA: Diagnosis not present

## 2019-12-01 DIAGNOSIS — Z6831 Body mass index (BMI) 31.0-31.9, adult: Secondary | ICD-10-CM | POA: Diagnosis not present

## 2019-12-01 DIAGNOSIS — Z9181 History of falling: Secondary | ICD-10-CM | POA: Diagnosis not present

## 2019-12-01 DIAGNOSIS — I10 Essential (primary) hypertension: Secondary | ICD-10-CM | POA: Diagnosis not present

## 2019-12-01 DIAGNOSIS — E785 Hyperlipidemia, unspecified: Secondary | ICD-10-CM | POA: Diagnosis not present

## 2019-12-16 DIAGNOSIS — M170 Bilateral primary osteoarthritis of knee: Secondary | ICD-10-CM | POA: Diagnosis not present

## 2019-12-16 DIAGNOSIS — M25561 Pain in right knee: Secondary | ICD-10-CM | POA: Diagnosis not present

## 2019-12-16 DIAGNOSIS — M25562 Pain in left knee: Secondary | ICD-10-CM | POA: Diagnosis not present

## 2020-01-09 DIAGNOSIS — M170 Bilateral primary osteoarthritis of knee: Secondary | ICD-10-CM | POA: Diagnosis not present

## 2020-04-21 DIAGNOSIS — R059 Cough, unspecified: Secondary | ICD-10-CM | POA: Diagnosis not present

## 2020-04-21 DIAGNOSIS — U071 COVID-19: Secondary | ICD-10-CM | POA: Diagnosis not present

## 2020-05-01 ENCOUNTER — Emergency Department (HOSPITAL_BASED_OUTPATIENT_CLINIC_OR_DEPARTMENT_OTHER)
Admission: EM | Admit: 2020-05-01 | Discharge: 2020-05-01 | Disposition: A | Discharge status: Home / Self Care | Payer: PPO | Attending: Emergency Medicine | Admitting: Emergency Medicine

## 2020-05-01 ENCOUNTER — Encounter (HOSPITAL_BASED_OUTPATIENT_CLINIC_OR_DEPARTMENT_OTHER): Payer: Self-pay

## 2020-05-01 ENCOUNTER — Other Ambulatory Visit: Payer: Self-pay

## 2020-05-01 DIAGNOSIS — Z79899 Other long term (current) drug therapy: Secondary | ICD-10-CM | POA: Diagnosis not present

## 2020-05-01 DIAGNOSIS — I10 Essential (primary) hypertension: Secondary | ICD-10-CM | POA: Insufficient documentation

## 2020-05-01 DIAGNOSIS — Z9049 Acquired absence of other specified parts of digestive tract: Secondary | ICD-10-CM | POA: Insufficient documentation

## 2020-05-01 DIAGNOSIS — Z90721 Acquired absence of ovaries, unilateral: Secondary | ICD-10-CM | POA: Insufficient documentation

## 2020-05-01 DIAGNOSIS — R1032 Left lower quadrant pain: Secondary | ICD-10-CM | POA: Insufficient documentation

## 2020-05-01 DIAGNOSIS — R109 Unspecified abdominal pain: Secondary | ICD-10-CM | POA: Diagnosis present

## 2020-05-01 LAB — COMPREHENSIVE METABOLIC PANEL
ALT: 24 U/L (ref 0–44)
AST: 24 U/L (ref 15–41)
Albumin: 3.9 g/dL (ref 3.5–5.0)
Alkaline Phosphatase: 101 U/L (ref 38–126)
Anion gap: 10 (ref 5–15)
BUN: 10 mg/dL (ref 8–23)
CO2: 25 mmol/L (ref 22–32)
Calcium: 9.4 mg/dL (ref 8.9–10.3)
Chloride: 102 mmol/L (ref 98–111)
Creatinine, Ser: 0.69 mg/dL (ref 0.44–1.00)
GFR, Estimated: >60 mL/min (ref >60)
Glucose, Bld: 96 mg/dL (ref 70–99)
Potassium: 3.5 mmol/L (ref 3.5–5.1)
Sodium: 137 mmol/L (ref 135–145)
Total Bilirubin: 0.6 mg/dL (ref 0.3–1.2)
Total Protein: 7.1 g/dL (ref 6.5–8.1)

## 2020-05-01 LAB — URINALYSIS, ROUTINE W REFLEX MICROSCOPIC
Bilirubin Urine: NEGATIVE
Glucose, UA: NEGATIVE mg/dL
Hgb urine dipstick: NEGATIVE
Ketones, ur: NEGATIVE mg/dL
Leukocytes,Ua: NEGATIVE
Nitrite: NEGATIVE
Protein, ur: NEGATIVE mg/dL
Specific Gravity, Urine: 1.01 (ref 1.005–1.030)
pH: 7.5 (ref 5.0–8.0)

## 2020-05-01 LAB — CBC WITH DIFFERENTIAL/PLATELET
Abs Immature Granulocytes: 0.03 10*3/uL (ref 0.00–0.07)
Basophils Absolute: 0 10*3/uL (ref 0.0–0.1)
Basophils Relative: 1 %
Eosinophils Absolute: 0.1 10*3/uL (ref 0.0–0.5)
Eosinophils Relative: 1 %
HCT: 39.9 % (ref 36.0–46.0)
Hemoglobin: 12.9 g/dL (ref 12.0–15.0)
Immature Granulocytes: 1 %
Lymphocytes Relative: 27 %
Lymphs Abs: 1.4 10*3/uL (ref 0.7–4.0)
MCH: 27.2 pg (ref 26.0–34.0)
MCHC: 32.3 g/dL (ref 30.0–36.0)
MCV: 84.2 fL (ref 80.0–100.0)
Monocytes Absolute: 0.4 10*3/uL (ref 0.1–1.0)
Monocytes Relative: 7 %
Neutro Abs: 3.4 10*3/uL (ref 1.7–7.7)
Neutrophils Relative %: 63 %
Platelets: 343 10*3/uL (ref 150–400)
RBC: 4.74 MIL/uL (ref 3.87–5.11)
RDW: 13.1 % (ref 11.5–15.5)
WBC: 5.3 10*3/uL (ref 4.0–10.5)
nRBC: 0 % (ref 0.0–0.2)

## 2020-05-01 LAB — LIPASE, BLOOD: Lipase: 49 U/L (ref 11–51)

## 2020-05-01 MED ORDER — AMOXICILLIN-POT CLAVULANATE 875-125 MG PO TABS: 1.0000 | ORAL_TABLET | Freq: Two times a day (BID) | ORAL | 0 refills | Status: AC

## 2020-05-01 NOTE — ED Triage Notes (Signed)
Pt c/o abd pain x 40 min-NAD-to triage in w/c

## 2020-05-01 NOTE — Discharge Instructions (Addendum)
Your laboratory work-up today is all entirely unremarkable.  However, you do have a history of diverticular disease and your left lower quadrant abdominal tenderness is concerning for early infection.  I prescribed you Augmentin.  Please take the antibiotics if your symptoms return or begin to worsen.  Otherwise, do not take it.  Please also follow-up with your primary care provider regarding today's encounter and for ongoing evaluation and management.  In the interim, please continue to take Tylenol or NSAIDs as needed for pain control.  Sure that you are eating and drinking regularly.  You should be having regular bowel movements.  No imaging was obtained today, but if your symptoms worsen you will need to return to the ED for CT.  Return to the ED or seek immediate medical attention for any new or worsening symptoms.

## 2020-05-01 NOTE — ED Provider Notes (Signed)
[facility] Provider Note   CSN: [id number] Arrival date & time: 05/01/20  1518     History Chief Complaint  Patient presents with  . Abdominal Pain    Jackie West is a 70 y.o. female with past medical history of HTN and HLD who presents the ED with complaints of acute onset left-sided abdominal pain.  I reviewed patient's medical record and CT abdomen and pelvis obtained in 2019 was notable for complex multiseptated cystic mass in the pelvis consistent with an ovarian origin lesion, recommended further characterization with MRI and/or surgical consult.  Cholelithiasis was also noted.  On my examination, patient is complaining of acute onset left-sided abdominal pain.  She states that she is s/p left-sided salpingo-oophorectomy and has been followed by OB/GYN.  She is also s/p cholecystectomy from general surgeon.  She denies any pelvic pain.  Admits that she is overdue on her colonoscopy.  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  She states that it was getting progressively worse which prompted her to come to the ED for evaluation.  Initially she thought that perhaps it was related to gas, but states that she has never experienced these symptoms before.  She reports that she felt perfectly fine this morning, denies any recent illness or infection, fevers or chills, nausea or emesis, melena, hematochezia, or other changes in her bowel habits, dysuria or other urinary symptoms, chest pain, shortness of breath, or other symptoms.  She admits that she is overdue on her colonoscopy.  She also states that she is s/p left-sided salpingo-oophorectomy as well as cholecystectomy, surgeries performed approximately 2 years ago.    HPI     Past Medical History:  Diagnosis Date  . Chronic back pain   . High cholesterol   .  Hypertension     There are no problems to display for this patient.   Past Surgical History:  Procedure Laterality Date  . ABDOMINAL HYSTERECTOMY    . CHOLECYSTECTOMY       OB History   No obstetric history on file.     No family history on file.  Social History   Tobacco Use  . Smoking status: Never Smoker  . Smokeless tobacco: Never Used  Substance Use Topics  . Alcohol use: No  . Drug use: No    Home Medications Prior to Admission medications   Medication Sig Start Date End Date Taking? Authorizing Provider  amoxicillin-clavulanate (AUGMENTIN) 875-125 MG tablet Take 1 tablet by mouth every 12 (twelve) hours. 05/01/20  Yes Corena Herter, PA-C  losartan-hydrochlorothiazide (HYZAAR) 100-12.5 MG per tablet Take 1 tablet by mouth daily.   Yes [provider]  Multiple Vitamin (MULTIVITAMIN) capsule Take 1 capsule by mouth daily.   Yes [provider]  simvastatin (ZOCOR) 40 MG tablet Take 40 mg by mouth every evening.   Yes [provider]  calcium-vitamin D (OSCAL-500) 500-400 MG-UNIT tablet Frequency:   Dosage:0   MG-UNIT  Instructions:  Note:1 tab daily 05/29/08   [provider]  celecoxib (CELEBREX) 100 MG capsule TAKE 1 CAPSULE(100 MG) BY MOUTH TWICE DAILY 07/27/17   [provider]  fish oil-omega-3 fatty acids 1000 MG capsule Take 1 g by mouth daily.    [provider]  hydrochlorothiazide (HYDRODIURIL) 25 MG tablet Take 0.5 tablets (12.5 mg total) by mouth daily.  06/27/17   Caccavale, Sophia, PA-C  ibuprofen (ADVIL,MOTRIN) 800 MG tablet Take 1 tablet (800 mg total) by mouth every 8 (eight) hours as needed. 08/06/17   Lawyer, Harrell Gave, PA-C  OVER THE COUNTER MEDICATION     [provider]  valsartan (DIOVAN) 160 MG tablet Take 1 tablet (160 mg total) by mouth daily. 06/27/17   Caccavale, Sophia, PA-C    Allergies    Patient has no known allergies.  Review of Systems   Review of Systems  All other  systems reviewed and are negative.   Physical Exam Updated Vital Signs BP (!) 188/78 (BP Location: Right Arm)   Pulse (!) 53   Temp 97.9 F (36.6 C) (Oral)   Resp (!) 22   Ht 5\' 5"  (1.651 m)   Wt 82.6 kg   SpO2 100%   BMI 30.29 kg/m   Physical Exam Vitals and nursing note reviewed. Exam conducted with a chaperone present.  Constitutional:      General: She is not in acute distress.    Appearance: She is not toxic-appearing.  HENT:     Head: Normocephalic and atraumatic.  Eyes:     General: No scleral icterus.    Conjunctiva/sclera: Conjunctivae normal.  Cardiovascular:     Rate and Rhythm: Normal rate and regular rhythm.     Pulses: Normal pulses.     Heart sounds: Normal heart sounds.  Pulmonary:     Effort: Pulmonary effort is normal. No respiratory distress.  Abdominal:     General: Abdomen is flat. There is no distension.     Palpations: Abdomen is soft.     Tenderness: There is abdominal tenderness. There is no guarding.     Hernia: A hernia is present.     Comments: Soft, nondistended.  No peritoneal signs.  TTP in left side of abdomen, most notably in LLQ.  Patient also has small incisional hernia noted in suprapubic region.  No evidence of incarceration.  Nontender.  No other masses or overlying skin changes. NABS.   Musculoskeletal:     Right lower leg: No edema.     Left lower leg: No edema.  Skin:    General: Skin is dry.  Neurological:     General: No focal deficit present.     Mental Status: She is alert and oriented to person, place, and time.     GCS: GCS eye subscore is 4. GCS verbal subscore is 5. GCS motor subscore is 6.  Psychiatric:        Mood and Affect: Mood normal.        Behavior: Behavior normal.        Thought Content: Thought content normal.     ED Results / Procedures / Treatments   Labs (all labs ordered are listed, but only abnormal results are displayed) Labs Reviewed  CBC WITH DIFFERENTIAL/PLATELET  COMPREHENSIVE METABOLIC  PANEL  LIPASE, BLOOD  URINALYSIS, ROUTINE W REFLEX MICROSCOPIC    EKG EKG Interpretation  Date/Time:  Tuesday May 01 2020 16:03:58 EST Ventricular Rate:  56 PR Interval:    QRS Duration: 92 QT Interval:  419 QTC Calculation: 405 R Axis:   2 Text Interpretation: Sinus rhythm Nonspecific T abnormalities, diffuse leads No STEMI Confirmed by Octaviano Glow [phone]) on 05/01/2020 5:04:04 PM   Radiology No results found.  Procedures Procedures   Medications Ordered in ED Medications - No data to display  ED Course  I have reviewed the triage vital signs and the nursing notes.  Pertinent labs & imaging results that were available during my care of the patient were reviewed by me and considered in my medical decision making (see chart for details).    MDM Rules/Calculators/A&P                          Jackie West was evaluated in Emergency Department on 05/01/2020 for the symptoms described in the history of present illness. She was evaluated in the context of the global COVID-19 pandemic, which necessitated consideration that the patient might be at risk for infection with the SARS-CoV-2 virus that causes COVID-19. Institutional protocols and algorithms that pertain to the evaluation of patients at risk for COVID-19 are in a state of rapid change based on information released by regulatory bodies including the CDC and federal and state organizations. These policies and algorithms were followed during the patient's care in the ED.  I personally reviewed patient's medical chart and all notes from triage and staff during today's encounter. I have also ordered and reviewed all labs and imaging that I felt to be medically necessary in the evaluation of this patient's complaints and with consideration of their with their physical exam. If needed, translation services were available and utilized.   On my evaluation, patient's pain has almost entirely improved.  She does have a  significant history of abdominal surgeries, however.  Concern for early obstruction versus ileus.  She also has a history of diverticular disease as noted on a CT from a couple of years ago.  Given that her pain symptoms have improved and she is no longer feeling ill, in conjunction with her reassuring laboratory work-up, feel as though we can p.o. challenge her and plan for discharge.  Discussed this with patient and she is understanding and agreeable.  Encouraging close outpatient follow-up.  I will also discharge her home with a 7-day course of Augmentin should her left lower quadrant abdominal discomfort return, to cover her for diverticulitis.  Lower suspicion for abscess or other acute/emergent intra-abdominal pathology.  Discussed case with Dr. Langston Masker who personally evaluated patient and agrees with assessment and plan.  All of the evaluation and work-up results were discussed with the patient and any family at bedside.  Patient and/or family were informed that while patient is appropriate for discharge at this time, some medical emergencies may only develop or become detectable after a period of time.  I specifically instructed patient and/or family to return to return to the ED or seek immediate medical attention for any new or worsening symptoms.  They were provided opportunity to ask any additional questions and have none at this time.  Prior to discharge patient is feeling well, agreeable with plan for discharge home.  They have expressed understanding of verbal discharge instructions as well as return precautions and are agreeable to the plan.    Final Clinical Impression(s) / ED Diagnoses Final diagnoses:  LLQ abdominal pain    Rx / DC Orders ED Discharge Orders         Ordered    amoxicillin-clavulanate (AUGMENTIN) 875-125 MG tablet  Every 12 hours        05/01/20 1839           Reita Chard 05/01/20 1912    Margette Fast, MD 05/02/20 [phone]

## 2020-05-01 NOTE — ED Notes (Signed)
ED Provider at bedside. Dr. Langston Masker

## 2020-05-04 DIAGNOSIS — R1032 Left lower quadrant pain: Secondary | ICD-10-CM | POA: Diagnosis not present

## 2020-05-04 DIAGNOSIS — Z1211 Encounter for screening for malignant neoplasm of colon: Secondary | ICD-10-CM | POA: Diagnosis not present

## 2020-07-03 DIAGNOSIS — Z1211 Encounter for screening for malignant neoplasm of colon: Secondary | ICD-10-CM | POA: Diagnosis not present

## 2020-07-03 DIAGNOSIS — D122 Benign neoplasm of ascending colon: Secondary | ICD-10-CM | POA: Diagnosis not present

## 2020-08-20 DIAGNOSIS — Z0000 Encounter for general adult medical examination without abnormal findings: Secondary | ICD-10-CM | POA: Diagnosis not present

## 2020-08-20 DIAGNOSIS — M170 Bilateral primary osteoarthritis of knee: Secondary | ICD-10-CM | POA: Diagnosis not present

## 2020-08-20 DIAGNOSIS — Z1231 Encounter for screening mammogram for malignant neoplasm of breast: Secondary | ICD-10-CM | POA: Diagnosis not present

## 2020-08-20 DIAGNOSIS — I10 Essential (primary) hypertension: Secondary | ICD-10-CM | POA: Diagnosis not present

## 2020-08-20 DIAGNOSIS — E785 Hyperlipidemia, unspecified: Secondary | ICD-10-CM | POA: Diagnosis not present

## 2020-08-20 DIAGNOSIS — Z780 Asymptomatic menopausal state: Secondary | ICD-10-CM | POA: Diagnosis not present

## 2020-08-20 DIAGNOSIS — E559 Vitamin D deficiency, unspecified: Secondary | ICD-10-CM | POA: Diagnosis not present

## 2020-09-17 DIAGNOSIS — M170 Bilateral primary osteoarthritis of knee: Secondary | ICD-10-CM | POA: Diagnosis not present

## 2021-01-09 DIAGNOSIS — R1032 Left lower quadrant pain: Secondary | ICD-10-CM | POA: Diagnosis not present

## 2021-01-09 DIAGNOSIS — M4316 Spondylolisthesis, lumbar region: Secondary | ICD-10-CM | POA: Diagnosis not present

## 2021-01-09 DIAGNOSIS — G8929 Other chronic pain: Secondary | ICD-10-CM | POA: Diagnosis not present

## 2021-01-09 DIAGNOSIS — I700 Atherosclerosis of aorta: Secondary | ICD-10-CM | POA: Diagnosis not present

## 2021-01-09 DIAGNOSIS — Q630 Accessory kidney: Secondary | ICD-10-CM | POA: Diagnosis not present

## 2021-01-09 DIAGNOSIS — K439 Ventral hernia without obstruction or gangrene: Secondary | ICD-10-CM | POA: Diagnosis not present

## 2021-01-09 DIAGNOSIS — M545 Low back pain, unspecified: Secondary | ICD-10-CM | POA: Diagnosis not present

## 2021-01-09 DIAGNOSIS — K432 Incisional hernia without obstruction or gangrene: Secondary | ICD-10-CM | POA: Diagnosis not present

## 2023-05-19 ENCOUNTER — Emergency Department (HOSPITAL_COMMUNITY): Payer: No Typology Code available for payment source

## 2023-05-19 ENCOUNTER — Emergency Department (HOSPITAL_COMMUNITY)
Admission: EM | Admit: 2023-05-19 | Discharge: 2023-05-19 | Disposition: A | Discharge status: Home / Self Care | Payer: No Typology Code available for payment source | Attending: Emergency Medicine | Admitting: Emergency Medicine

## 2023-05-19 ENCOUNTER — Other Ambulatory Visit: Payer: Self-pay

## 2023-05-19 ENCOUNTER — Encounter (HOSPITAL_COMMUNITY): Payer: Self-pay

## 2023-05-19 DIAGNOSIS — R7401 Elevation of levels of liver transaminase levels: Secondary | ICD-10-CM | POA: Diagnosis not present

## 2023-05-19 DIAGNOSIS — I10 Essential (primary) hypertension: Secondary | ICD-10-CM | POA: Diagnosis not present

## 2023-05-19 DIAGNOSIS — R1010 Upper abdominal pain, unspecified: Secondary | ICD-10-CM | POA: Diagnosis present

## 2023-05-19 DIAGNOSIS — J101 Influenza due to other identified influenza virus with other respiratory manifestations: Secondary | ICD-10-CM | POA: Diagnosis not present

## 2023-05-19 DIAGNOSIS — Z79899 Other long term (current) drug therapy: Secondary | ICD-10-CM | POA: Insufficient documentation

## 2023-05-19 LAB — CBC
HCT: 41.9 % (ref 36.0–46.0)
Hemoglobin: 13.3 g/dL (ref 12.0–15.0)
MCH: 27.3 pg (ref 26.0–34.0)
MCHC: 31.7 g/dL (ref 30.0–36.0)
MCV: 86 fL (ref 80.0–100.0)
Platelets: 293 10*3/uL (ref 150–400)
RBC: 4.87 MIL/uL (ref 3.87–5.11)
RDW: 13.5 % (ref 11.5–15.5)
WBC: 8.4 10*3/uL (ref 4.0–10.5)
nRBC: 0 % (ref 0.0–0.2)

## 2023-05-19 LAB — TROPONIN I (HIGH SENSITIVITY): Troponin I (High Sensitivity): 2 ng/L (ref <18)

## 2023-05-19 LAB — RESP PANEL BY RT-PCR (RSV, FLU A&B, COVID)  RVPGX2
Influenza A by PCR: POSITIVE — AB
Influenza B by PCR: NEGATIVE
Resp Syncytial Virus by PCR: NEGATIVE
SARS Coronavirus 2 by RT PCR: NEGATIVE

## 2023-05-19 LAB — COMPREHENSIVE METABOLIC PANEL
ALT: 67 U/L — ABNORMAL HIGH (ref 0–44)
AST: 71 U/L — ABNORMAL HIGH (ref 15–41)
Albumin: 3.7 g/dL (ref 3.5–5.0)
Alkaline Phosphatase: 107 U/L (ref 38–126)
Anion gap: 11 (ref 5–15)
BUN: 10 mg/dL (ref 8–23)
CO2: 23 mmol/L (ref 22–32)
Calcium: 9.1 mg/dL (ref 8.9–10.3)
Chloride: 102 mmol/L (ref 98–111)
Creatinine, Ser: 0.84 mg/dL (ref 0.44–1.00)
GFR, Estimated: >60 mL/min (ref >60)
Glucose, Bld: 121 mg/dL — ABNORMAL HIGH (ref 70–99)
Potassium: 3.8 mmol/L (ref 3.5–5.1)
Sodium: 136 mmol/L (ref 135–145)
Total Bilirubin: 0.9 mg/dL (ref 0.0–1.2)
Total Protein: 7 g/dL (ref 6.5–8.1)

## 2023-05-19 LAB — URINALYSIS, ROUTINE W REFLEX MICROSCOPIC
Bilirubin Urine: NEGATIVE
Glucose, UA: NEGATIVE mg/dL
Hgb urine dipstick: NEGATIVE
Ketones, ur: NEGATIVE mg/dL
Leukocytes,Ua: NEGATIVE
Nitrite: NEGATIVE
Protein, ur: NEGATIVE mg/dL
Specific Gravity, Urine: 1.016 (ref 1.005–1.030)
pH: 8 (ref 5.0–8.0)

## 2023-05-19 LAB — LIPASE, BLOOD: Lipase: 56 U/L — ABNORMAL HIGH (ref 11–51)

## 2023-05-19 MED ORDER — ACETAMINOPHEN 325 MG PO TABS
650.0000 mg | ORAL_TABLET | Freq: Once | ORAL | Status: AC
  Administered 2023-05-19: 650 mg via ORAL
  Filled 2023-05-19: qty 2

## 2023-05-19 MED ORDER — DICYCLOMINE HCL 10 MG/ML IM SOLN
20.0000 mg | Freq: Once | INTRAMUSCULAR | Status: AC
  Administered 2023-05-19: 20 mg via INTRAMUSCULAR
  Filled 2023-05-19: qty 2

## 2023-05-19 MED ORDER — LACTATED RINGERS IV BOLUS
1000.0000 mL | Freq: Once | INTRAVENOUS | Status: AC
  Administered 2023-05-19: 1000 mL via INTRAVENOUS

## 2023-05-19 MED ORDER — ONDANSETRON 4 MG PO TBDP: 4.0000 mg | ORAL_TABLET | Freq: Three times a day (TID) | ORAL | 0 refills | Status: AC | PRN

## 2023-05-19 MED ORDER — DICYCLOMINE HCL 20 MG PO TABS: 20.0000 mg | ORAL_TABLET | Freq: Two times a day (BID) | ORAL | 0 refills | Status: AC

## 2023-05-19 MED ORDER — ONDANSETRON HCL 4 MG/2ML IJ SOLN
4.0000 mg | Freq: Once | INTRAMUSCULAR | Status: AC
  Administered 2023-05-19: 4 mg via INTRAVENOUS
  Filled 2023-05-19: qty 2

## 2023-05-19 MED ORDER — OSELTAMIVIR PHOSPHATE 75 MG PO CAPS
75.0000 mg | ORAL_CAPSULE | Freq: Two times a day (BID) | ORAL | 0 refills | Status: AC
Start: 2023-05-19 — End: 2023-05-24

## 2023-05-19 NOTE — ED Provider Notes (Signed)
 [facility] AT [facility] Provider Note   CSN: [id number] Arrival date & time: 05/19/23  3875     History  Chief Complaint  Patient presents with   Emesis    Jackie West is a 73 y.o. female PMH osteoarthritis, prediabetes, insomnia, HTN, HLD   Emesis Associated symptoms: abdominal pain and diarrhea   Associated symptoms: no arthralgias, no chills, no cough, no fever and no sore throat    Presents with acute onset of abdominal pain, vomiting, and diarrhea.  The pain, described as a 'scraping pain,' is located in the upper abdomen.  The patient reports vomiting up 'liquid and food' and has been experiencing diarrhea since yesterday These symptoms began yesterday and have persisted into today.  The patient also reports feeling feverish and experiencing chills. Has been coughing up mucus since yesterday.  They deny any chest pain but report mild shortness of breath.  The patient denies any blood in their vomit or stool. No tarry stools They also report daily use of ibuprofen, but did not take any yesterday.  Has a history of gallbladder removal and a partial hysterectomy.     Home Medications Prior to Admission medications   Medication Sig Start Date End Date Taking? Authorizing Provider  dicyclomine (BENTYL) 20 MG tablet Take 1 tablet (20 mg total) by mouth 2 (two) times daily. 05/19/23  Yes Levin Erp, MD  ondansetron (ZOFRAN-ODT) 4 MG disintegrating tablet Take 1 tablet (4 mg total) by mouth every 8 (eight) hours as needed for nausea or vomiting. 05/19/23  Yes Levin Erp, MD  oseltamivir (TAMIFLU) 75 MG capsule Take 1 capsule (75 mg total) by mouth every 12 (twelve) hours for 5 days. 05/19/23 05/24/23 Yes Levin Erp, MD  amoxicillin-clavulanate (AUGMENTIN) 875-125 MG tablet Take 1 tablet by mouth every 12 (twelve) hours. 05/01/20   Lorelee New, PA-C  calcium-vitamin D (OSCAL-500) 500-400 MG-UNIT tablet Frequency:   Dosage:0    MG-UNIT  Instructions:  Note:1 tab daily 05/29/08   [provider]  celecoxib (CELEBREX) 100 MG capsule TAKE 1 CAPSULE(100 MG) BY MOUTH TWICE DAILY 07/27/17   [provider]  fish oil-omega-3 fatty acids 1000 MG capsule Take 1 g by mouth daily.    [provider]  hydrochlorothiazide (HYDRODIURIL) 25 MG tablet Take 0.5 tablets (12.5 mg total) by mouth daily. 06/27/17   Caccavale, Sophia, PA-C  ibuprofen (ADVIL,MOTRIN) 800 MG tablet Take 1 tablet (800 mg total) by mouth every 8 (eight) hours as needed. 08/06/17   Lawyer, Cristal Deer, PA-C  losartan-hydrochlorothiazide (HYZAAR) 100-12.5 MG per tablet Take 1 tablet by mouth daily.    [provider]  Multiple Vitamin (MULTIVITAMIN) capsule Take 1 capsule by mouth daily.    [provider]  OVER THE COUNTER MEDICATION     [provider]  simvastatin (ZOCOR) 40 MG tablet Take 40 mg by mouth every evening.    [provider]  valsartan (DIOVAN) 160 MG tablet Take 1 tablet (160 mg total) by mouth daily. 06/27/17   Caccavale, Sophia, PA-C      Allergies    Patient has no known allergies.    Review of Systems   Review of Systems  Constitutional:  Negative for chills and fever.  HENT:  Negative for ear pain and sore throat.   Eyes:  Negative for pain and visual disturbance.  Respiratory:  Negative for cough and shortness of breath.   Cardiovascular:  Negative for chest pain and palpitations.  Gastrointestinal:  Positive  for abdominal pain, diarrhea, nausea and vomiting. Negative for blood in stool.  Genitourinary:  Negative for dysuria and hematuria.  Musculoskeletal:  Negative for arthralgias and back pain.  Skin:  Negative for color change and rash.  Neurological:  Negative for seizures and syncope.  All other systems reviewed and are negative.   Physical Exam Updated Vital Signs BP (!) 128/57   Pulse 77   Temp 98.5 F (36.9 C) (Oral)   Resp 20   Ht 5\' 5"  (1.651 m)   Wt 77.1  kg   SpO2 97%   BMI 28.29 kg/m  Physical Exam Vitals and nursing note reviewed.  Constitutional:      General: She is not in acute distress.    Appearance: She is well-developed. She is ill-appearing. She is not toxic-appearing.  HENT:     Head: Normocephalic and atraumatic.     Mouth/Throat:     Mouth: Mucous membranes are moist.     Pharynx: No oropharyngeal exudate.  Eyes:     Conjunctiva/sclera: Conjunctivae normal.  Cardiovascular:     Rate and Rhythm: Normal rate and regular rhythm.     Heart sounds: No murmur heard. Pulmonary:     Effort: Pulmonary effort is normal. No respiratory distress.     Breath sounds: Normal breath sounds.  Abdominal:     General: There is no distension.     Palpations: Abdomen is soft.     Tenderness: There is no abdominal tenderness. There is no right CVA tenderness, left CVA tenderness, guarding or rebound.  Musculoskeletal:        General: No swelling.     Cervical back: Neck supple.  Skin:    General: Skin is warm and dry.     Capillary Refill: Capillary refill takes less than 2 seconds.  Neurological:     Mental Status: She is alert.  Psychiatric:        Mood and Affect: Mood normal.     ED Results / Procedures / Treatments   Labs (all labs ordered are listed, but only abnormal results are displayed) Labs Reviewed  RESP PANEL BY RT-PCR (RSV, FLU A&B, COVID)  RVPGX2 - Abnormal; Notable for the following components:      Result Value   Influenza A by PCR POSITIVE (*)    All other components within normal limits  LIPASE, BLOOD - Abnormal; Notable for the following components:   Lipase 56 (*)    All other components within normal limits  COMPREHENSIVE METABOLIC PANEL - Abnormal; Notable for the following components:   Glucose, Bld 121 (*)    AST 71 (*)    ALT 67 (*)    All other components within normal limits  URINALYSIS, ROUTINE W REFLEX MICROSCOPIC  CBC  TROPONIN I (HIGH SENSITIVITY)    EKG EKG  Interpretation Date/Time:  Tuesday May 19 2023 10:17:44 EST Ventricular Rate:  76 PR Interval:  200 QRS Duration:  100 QT Interval:  331 QTC Calculation: 365 R Axis:   17  Text Interpretation: Sinus rhythm Ventricular premature complex Low voltage, precordial leads Borderline repolarization abnormality No significant change since last tracing Confirmed by Alvira Monday ([id number]) on 05/19/2023 1:11:47 PM  Radiology DG Chest 2 View Result Date: 05/19/2023 CLINICAL DATA:  Shortness of breath and cough. EXAM: CHEST - 2 VIEW COMPARISON:  None Available. FINDINGS: No focal consolidation, pleural effusion, pneumothorax. The cardiac silhouette is within normal limits. The atherosclerotic calcification of the aorta. Degenerative changes of the spine. No acute osseous  pathology. IMPRESSION: No active cardiopulmonary disease. Electronically Signed   By: Elgie Collard M.D.   On: 05/19/2023 14:50    Procedures Procedures   Medications Ordered in ED Medications  ondansetron (ZOFRAN) injection 4 mg (4 mg Intravenous Given 05/19/23 1037)  acetaminophen (TYLENOL) tablet 650 mg (650 mg Oral Given 05/19/23 1041)  dicyclomine (BENTYL) injection 20 mg (20 mg Intramuscular Given 05/19/23 1041)  lactated ringers bolus 1,000 mL (0 mLs Intravenous Stopped 05/19/23 1249)    ED Course/ Medical Decision Making/ A&P                                Medical Decision Making Amount and/or Complexity of Data Reviewed Labs: ordered.  Risk Prescription drug management.   Medical Decision Making:   Jackie West is a 73 y.o. female who presented to the ED today with diarrhea, emesis detailed above.    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  Patient placed on continuous vitals and telemetry monitoring while in ED which was reviewed periodically.  Complete initial physical exam  performed, notably the patient  was nontender on abdominal examination.    Reviewed and confirmed nursing documentation for past medical history, family history, social history.    Initial Assessment:   With the patient's presentation of diarrhea, emesis, most likely diagnosis is viral URI. Other diagnoses were considered including (but not limited to) cardiac etiology, reflux/GERD. These are considered less likely due to history of present illness and physical exam findings.   This is most consistent with an acute complicated illness  Initial Plan:  Lipase, Covid/Flu/RSV swab Bentyl, tylenol, zofran Screening labs including CBC and Metabolic panel to evaluate for infectious or metabolic etiology of disease.  Urinalysis with reflex culture ordered to evaluate for UTI or relevant urologic/nephrologic pathology.  EKG and troponin to evaluate for cardiac pathology Objective evaluation as below reviewed   Initial Study Results:   Laboratory  All laboratory results reviewed without evidence of clinically relevant pathology.  Exceptions include: AST/ALT mildly elevated at 71/67 (possibly related to dehydration), lipase very mildly elevated to 56, respiratory panel positive for influenza A  EKG EKG was reviewed independently. Rate, rhythm, axis, intervals all examined and without medically relevant abnormality. ST segments without concerns for elevations. Intermittent PVCs  Radiology:  All images reviewed independently. Agree with radiology report at this time.   DG Chest 2 View Result Date: 05/19/2023 CLINICAL DATA:  Shortness of breath and cough. EXAM: CHEST - 2 VIEW COMPARISON:  None Available. FINDINGS: No focal consolidation, pleural effusion, pneumothorax. The cardiac silhouette is within normal limits. The atherosclerotic calcification of the aorta. Degenerative changes of the spine. No acute osseous pathology. IMPRESSION: No active cardiopulmonary disease. Electronically Signed   By: Elgie Collard M.D.   On: 05/19/2023 14:50    Final Assessment and Plan:   73 YO female here for vomiting, nausea, diarrhea found to be influenza A positive.  Patient tolerated p.o. trial well after antinausea medicines and Bentyl.  She was discharged home with symptomatic medications including Zofran and Bentyl.  Patient was offered Tamiflu which she would like just in case-did discuss that this may worsen GI symptoms.  Clinical Impression:  1. Influenza A      Discharge   Final Clinical Impression(s) / ED Diagnoses Final diagnoses:  Influenza A  Rx / DC Orders ED Discharge Orders          Ordered    ondansetron (ZOFRAN-ODT) 4 MG disintegrating tablet  Every 8 hours PRN        05/19/23 1505    dicyclomine (BENTYL) 20 MG tablet  2 times daily        05/19/23 1505    oseltamivir (TAMIFLU) 75 MG capsule  Every 12 hours        05/19/23 1505              Levin Erp, MD 05/19/23 1507    Alvira Monday, MD 05/19/23 [phone]

## 2023-05-19 NOTE — ED Notes (Signed)
 Patient transported to X-ray

## 2023-05-19 NOTE — ED Notes (Signed)
Patient given water for fluid challenge.  

## 2023-05-19 NOTE — Discharge Instructions (Addendum)
 You are flu positive here Your chest x-ray was negative for pneumonia I am sending you in medications to help-know that Tamiflu may make nausea/GI symptoms worse

## 2023-05-19 NOTE — ED Triage Notes (Addendum)
 Patient BIB [facility] from home. Woke up today with vomiting and diarrhea. Abdominal pain all over. Family is sick.
